# Patient Record
Sex: Female | Born: 1975 | Race: White | Hispanic: No | Marital: Married | Smoking: Never smoker
Health system: Southern US, Community
[De-identification: ages and names within clinical notes are randomized; demographics above are authoritative.]

## PROBLEM LIST (undated history)

## (undated) DIAGNOSIS — K219 Gastro-esophageal reflux disease without esophagitis: Secondary | ICD-10-CM

## (undated) DIAGNOSIS — N289 Disorder of kidney and ureter, unspecified: Secondary | ICD-10-CM

## (undated) DIAGNOSIS — F329 Major depressive disorder, single episode, unspecified: Secondary | ICD-10-CM

## (undated) DIAGNOSIS — F32A Depression, unspecified: Secondary | ICD-10-CM

## (undated) DIAGNOSIS — I10 Essential (primary) hypertension: Secondary | ICD-10-CM

---

## 1997-07-23 ENCOUNTER — Other Ambulatory Visit: Admission: RE | Admit: 1997-07-23 | Discharge: 1997-07-23 | Payer: Self-pay | Admitting: Obstetrics and Gynecology

## 1998-01-22 ENCOUNTER — Inpatient Hospital Stay (HOSPITAL_COMMUNITY): Admission: AD | Admit: 1998-01-22 | Discharge: 1998-01-25 | Payer: Self-pay | Admitting: Gynecology

## 1998-01-27 ENCOUNTER — Encounter (HOSPITAL_COMMUNITY): Admission: RE | Admit: 1998-01-27 | Discharge: 1998-03-20 | Payer: Self-pay | Admitting: Obstetrics and Gynecology

## 1998-03-09 ENCOUNTER — Other Ambulatory Visit: Admission: RE | Admit: 1998-03-09 | Discharge: 1998-03-09 | Payer: Self-pay | Admitting: Obstetrics and Gynecology

## 1999-03-16 ENCOUNTER — Other Ambulatory Visit: Admission: RE | Admit: 1999-03-16 | Discharge: 1999-03-16 | Payer: Self-pay | Admitting: Obstetrics and Gynecology

## 2000-04-17 ENCOUNTER — Other Ambulatory Visit: Admission: RE | Admit: 2000-04-17 | Discharge: 2000-04-17 | Payer: Self-pay | Admitting: Gynecology

## 2000-04-18 ENCOUNTER — Other Ambulatory Visit: Admission: RE | Admit: 2000-04-18 | Discharge: 2000-04-18 | Payer: Self-pay | Admitting: Gynecology

## 2000-11-30 ENCOUNTER — Other Ambulatory Visit: Admission: RE | Admit: 2000-11-30 | Discharge: 2000-11-30 | Payer: Self-pay | Admitting: Gynecology

## 2001-06-25 ENCOUNTER — Inpatient Hospital Stay (HOSPITAL_COMMUNITY): Admission: AD | Admit: 2001-06-25 | Discharge: 2001-06-27 | Payer: Self-pay | Admitting: *Deleted

## 2001-08-06 ENCOUNTER — Other Ambulatory Visit: Admission: RE | Admit: 2001-08-06 | Discharge: 2001-08-06 | Payer: Self-pay | Admitting: *Deleted

## 2002-09-06 ENCOUNTER — Other Ambulatory Visit: Admission: RE | Admit: 2002-09-06 | Discharge: 2002-09-06 | Payer: Self-pay | Admitting: Gynecology

## 2003-09-09 ENCOUNTER — Other Ambulatory Visit: Admission: RE | Admit: 2003-09-09 | Discharge: 2003-09-09 | Payer: Self-pay | Admitting: Gynecology

## 2004-09-15 ENCOUNTER — Other Ambulatory Visit: Admission: RE | Admit: 2004-09-15 | Discharge: 2004-09-15 | Payer: Self-pay | Admitting: Gynecology

## 2005-09-27 ENCOUNTER — Other Ambulatory Visit: Admission: RE | Admit: 2005-09-27 | Discharge: 2005-09-27 | Payer: Self-pay | Admitting: Gynecology

## 2011-08-21 ENCOUNTER — Emergency Department (INDEPENDENT_AMBULATORY_CARE_PROVIDER_SITE_OTHER): Payer: BC Managed Care – PPO

## 2011-08-21 ENCOUNTER — Encounter (HOSPITAL_BASED_OUTPATIENT_CLINIC_OR_DEPARTMENT_OTHER): Payer: Self-pay | Admitting: *Deleted

## 2011-08-21 ENCOUNTER — Emergency Department (HOSPITAL_BASED_OUTPATIENT_CLINIC_OR_DEPARTMENT_OTHER)
Admission: EM | Admit: 2011-08-21 | Discharge: 2011-08-21 | Disposition: A | Discharge status: Home / Self Care | Payer: BC Managed Care – PPO | Attending: Emergency Medicine | Admitting: Emergency Medicine

## 2011-08-21 DIAGNOSIS — M25529 Pain in unspecified elbow: Secondary | ICD-10-CM | POA: Insufficient documentation

## 2011-08-21 DIAGNOSIS — S53106A Unspecified dislocation of unspecified ulnohumeral joint, initial encounter: Secondary | ICD-10-CM | POA: Insufficient documentation

## 2011-08-21 DIAGNOSIS — W19XXXA Unspecified fall, initial encounter: Secondary | ICD-10-CM

## 2011-08-21 DIAGNOSIS — W1809XA Striking against other object with subsequent fall, initial encounter: Secondary | ICD-10-CM | POA: Insufficient documentation

## 2011-08-21 DIAGNOSIS — Z09 Encounter for follow-up examination after completed treatment for conditions other than malignant neoplasm: Secondary | ICD-10-CM

## 2011-08-21 MED ORDER — KETAMINE HCL 10 MG/ML IJ SOLN
INTRAMUSCULAR | Status: AC
  Administered 2011-08-21: 129 mg via INTRAVENOUS
  Filled 2011-08-21: qty 1

## 2011-08-21 MED ORDER — OXYCODONE-ACETAMINOPHEN 5-325 MG PO TABS: 1.0000 | ORAL_TABLET | ORAL | Status: AC | PRN

## 2011-08-21 MED ORDER — ONDANSETRON HCL 4 MG/2ML IJ SOLN
INTRAMUSCULAR | Status: AC
  Administered 2011-08-21: 4 mg via INTRAVENOUS
  Filled 2011-08-21: qty 2

## 2011-08-21 MED ORDER — KETAMINE HCL 50 MG/ML IJ SOLN: 1.5000 mg/kg | Freq: Once | INTRAMUSCULAR | Status: DC

## 2011-08-21 MED ORDER — ONDANSETRON 8 MG PO TBDP: 8.0000 mg | ORAL_TABLET | Freq: Three times a day (TID) | ORAL | Status: AC | PRN

## 2011-08-21 MED ORDER — ONDANSETRON 8 MG PO TBDP
ORAL_TABLET | ORAL | Status: AC
  Filled 2011-08-21: qty 1

## 2011-08-21 MED ORDER — HYDROMORPHONE HCL PF 1 MG/ML IJ SOLN
1.0000 mg | Freq: Once | INTRAMUSCULAR | Status: AC
  Administered 2011-08-21: 1 mg via INTRAVENOUS
  Filled 2011-08-21: qty 1

## 2011-08-21 MED ORDER — SODIUM CHLORIDE 0.9 % IV SOLN
Freq: Once | INTRAVENOUS | Status: AC
  Administered 2011-08-21: 20:00:00 via INTRAVENOUS

## 2011-08-21 MED ORDER — ONDANSETRON 8 MG PO TBDP
8.0000 mg | ORAL_TABLET | Freq: Once | ORAL | Status: AC
  Administered 2011-08-21: 8 mg via ORAL

## 2011-08-21 NOTE — ED Notes (Signed)
Place 3rd call to [facility] for hand surgery-MT

## 2011-08-21 NOTE — ED Notes (Signed)
Patient placed in sugartong splint with help from Onalee Hua, EMT. Sling reapplied and instructions given on care of splint.

## 2011-08-21 NOTE — ED Notes (Signed)
Patient states that she was walking down a [place] and fell. Her right arm is injured, clear deformity to elbow.

## 2011-08-21 NOTE — ED Provider Notes (Signed)
History   This chart was scribed for Hilario Quarry, MD by Charolett Bumpers . The patient was seen in room MH03/MH03.    CSN: [id number]  Arrival date & time 08/21/11  4098   First MD Initiated Contact with Patient 08/21/11 1901      Chief Complaint  Patient presents with  . Arm Injury    (Consider location/radiation/quality/duration/timing/severity/associated sxs/prior treatment) HPI Jenny Vasquez is a 36 y.o. female who presents to the Emergency Department complaining of constant, moderate right elbow pain that started PTA. Patient states she was walking when she she fell on her right arm. Patient states that she caught herself with her right forearm. Patient states that the pain is concentrated in her elbow. Patient denies any weakness, tingling or numbness in extremities. Patient denies taking anything for pain. Patient denies any other injuries or symptoms. Patient denies any pertinent medical or surgical hx.    History reviewed. No pertinent past medical history.  History reviewed. No pertinent past surgical history.  No family history on file.  History  Substance Use Topics  . Smoking status: Not on file  . Smokeless tobacco: Not on file  . Alcohol Use: Not on file    OB History    Grav Para Term Preterm Abortions TAB SAB Ect Mult Living                  Review of Systems  Musculoskeletal:       Right elbow pain.   All other systems reviewed and are negative.    Allergies  Penicillins and Diflucan  Home Medications   Current Outpatient Rx  Name Route Sig Dispense Refill  . BUPROPION HCL ER (XL) 150 MG PO TB24 Oral Take 150 mg by mouth daily.    . IBUPROFEN 200 MG PO TABS Oral Take 400 mg by mouth every 6 (six) hours as needed. Patient used this medication for headache.      BP 111/55  Pulse 62  Temp(Src) 97.6 F (36.4 C) (Oral)  Resp 20  SpO2 99%  LMP 08/11/2011  Physical Exam  Nursing note and vitals reviewed. Constitutional:  She is oriented to person, place, and time. She appears well-developed and well-nourished. No distress.  HENT:  Head: Normocephalic and atraumatic.  Right Ear: External ear normal.  Eyes: EOM are normal. Pupils are equal, round, and reactive to light.  Neck: Neck supple. No tracheal deviation present.  Cardiovascular: Normal rate.   Pulmonary/Chest: Effort normal. No respiratory distress.  Abdominal: Soft. She exhibits no distension.  Musculoskeletal: She exhibits no edema.       Right elbow: She exhibits decreased range of motion and deformity. tenderness found.       Right forearm sensation intact. Right radial pulse normal.   Neurological: She is alert and oriented to person, place, and time. No sensory deficit.  Skin: Skin is warm and dry.  Psychiatric: She has a normal mood and affect. Her behavior is normal.    ED Course  Reduction of fracture Date/Time: 08/21/2011 9:00 PM Performed by: Hilario Quarry Authorized by: Hilario Quarry Consent: Verbal consent obtained. Risks and benefits: risks, benefits and alternatives were discussed Consent given by: patient Patient understanding: patient states understanding of the procedure being performed Patient identity confirmed: verbally with patient and arm band Time out: Immediately prior to procedure a "time out" was called to verify the correct patient, procedure, equipment, support staff and site/side marked as required. Local anesthesia used: no Patient  sedated: yes Sedatives: ketamine Analgesia: hydromorphone Sedation start date/time: 08/21/2011 8:30 PM Sedation end date/time: 08/21/2011 9:04 PM Vitals: Vital signs were monitored during sedation. Patient tolerance: Patient tolerated the procedure well with no immediate complications.  Reduction of fracture Date/Time: 08/21/2011 9:05 PM Performed by: Hilario Quarry Authorized by: Hilario Quarry Consent: Verbal consent obtained. Written consent obtained. Risks and benefits:  risks, benefits and alternatives were discussed Consent given by: patient Patient understanding: patient states understanding of the procedure being performed Patient consent: the patient's understanding of the procedure matches consent given Time out: Immediately prior to procedure a "time out" was called to verify the correct patient, procedure, equipment, support staff and site/side marked as required. Local anesthesia used: no Patient sedated: yes Patient tolerance: Patient tolerated the procedure well with no immediate complications. Comments: Right elbow fracture dislocation reduced with manipulation and flexion/extension.  Palpable reduction of dislocation.  Pulsed remain in place. Splint placed.  Finger with good sensation pink with good cap refill.    (including critical care time)  DIAGNOSTIC STUDIES: Oxygen Saturation is 99% on room air, normal by my interpretation.    COORDINATION OF CARE:  1908: Discussed planned course of treatment with the patient who is agreeable at this time. Will order x-rays.  1930: Medication Orders: Hydromorphone (Dilaudid) injection 1 mg-once 1945: Medication Orders: 0.9% Sodium Chloride infusion.  2029: Preformed a reduction on the dislocated right elbow.    Labs Reviewed - No data to display Dg Elbow 2 Views Right  08/21/2011  *RADIOLOGY REPORT*  Clinical Data: Fall.  Elbow pain.  RIGHT ELBOW - 2 VIEW  Comparison: None.  Findings: The frontal projection is obliqued due to limitations in patient positioning.  Elbow dislocation noted with radius and ulna displaced to the side of the distal humerus, probably laterally although correlation with physical exam is suggested as I am uncertain of the degree of internal or external rotation of the elbow on the oblique projection.  There does appear to be an abnormal bony fragment projecting over the proximal ulna on the lateral projection and projecting between the radius and humerus on the oblique projection,  possibly representing a fragment from the distal humerus or coronoid process region.  IMPRESSION:  1.  Fracture dislocation of the elbow.  Original Report Authenticated By: Dellia Cloud, M.D.     No diagnosis found.  Patient's care discussed with Dr. Mina Marble called. She is to call the office tomorrow to be seen on Tuesday for followup.  MDM  Patient with sugar tong applied with fingers remaining pink with good cap refill and pulses intact.      Hilario Quarry, MD 08/21/11 2152

## 2011-08-21 NOTE — Discharge Instructions (Signed)
Elbow Dislocation  Elbow dislocation is the displacement of the bones that form the elbow joint. Three bones come together to form the elbow. The humerus is the bone in the upper arm. The radius and ulna are the 2 bones in the forearm that form the lower part of the elbow. The elbow is held in place by very strong, fibrous tissues (ligaments) that connect the bones to each other.  CAUSES  Elbow dislocations are not common. Typically, they occur when a person falls forward with hands and elbows outstretched. The force of the impact is sent to the elbow. Usually, there is a twisting motion in this force. Elbow dislocations also happen during car crashes when passengers reach out to brace themselves during the impact.  RISK FACTORS  Although dislocation of the elbow can happen to anyone, some people are at greater risk than others. People at increased risk of elbow dislocation include:   People born with greater looseness in their ligaments.   People born with an ulna bone that has a shallow groove for the elbow hinge joint.  SYMPTOMS  Symptoms of a complete elbow dislocation usually are obvious. They include extreme pain and the appearance of a deformed arm.   Symptoms of a partial dislocation may not be obvious. Your elbow may move somewhat, but you may have pain and swelling. Also, there will likely be bruising on the inside and outside of your elbow where ligaments have been stretched or torn.   DIAGNOSIS   To diagnose elbow dislocation, your caregiver will perform a physical exam. During this exam, your caregiver will check your arm for tenderness, swelling, and deformity. The skin around your arm and the circulation in your arm also will be checked. Your pulse will be checked at your wrist. If your artery is injured during dislocation, your hand will be cool to the touch and may be white or purple in color. Your caregiver also may check your arm and your ability to move your wrist and fingers to see if you had  any damage to your nerves during dislocation.  An X-Basem Yannuzzi exam also may be done to determine if there is bone injury. Results of an X-Rakeya Glab exam can help show the direction of the dislocation.  If you have a simple dislocation, there is no major bone injury. If you have a complex dislocation, you may have broken bones (fractures) associated with the ligament injuries.  TREATMENT  For a simple elbow dislocation, your bones can usually be realigned in a procedure called a reduction. This is a treatment in which your bones are manually moved back into place either with the use of numbing medicine (regional anesthetic) around your elbow or medicine to make you sleep (general anesthetic). Then your elbow is kept immobile with a sling or a splint for 2 to 3 weeks. This is followed with physical therapy to help your joint move again.  Complex elbow dislocation may require surgery to restore joint alignment and repair ligaments. After surgery, your elbow may be protected with an external hinge. This device keeps your elbow from dislocating again while motion exercises are done. Additional surgery may be needed to repair any injuries to blood vessels and nerves or bones and ligaments or to relieve pressure from excessive swelling around the muscles.  HOME CARE INSTRUCTIONS  The following measures can help to reduce pain and hasten the healing process:   Rest your injured joint. Do not move it. Avoid activities similar to the one that caused   to your injured joint for 1 to 2 days after your reduction or as directed by your caregiver. Applying ice helps to reduce inflammation and pain.   Put ice in a plastic bag.   Place a towel between your skin and the bag.   Leave the ice on for 15 to 20 minutes at a time, every couple of hours while you are awake.   Elevate your arm above your heart and move your wrist and fingers as  instructed by your caregiver to help limit swelling.   Take over-the-counter or prescription medicines for pain as directed by your caregiver.  SEEK IMMEDIATE MEDICAL CARE IF:  Your splint becomes damaged.   You have an external hinge and it becomes loose or will not move.   You have an external hinge and you develop drainage around the pins.   Your pain becomes worse rather than better.   You lose feeling in your hand or fingers.  MAKE SURE YOU:  Understand these instructions.   Will watch your condition.   Will get help right away if you are not doing well or get worse.  Document Released: 03/22/2001 Document Revised: 03/17/2011 Document Reviewed: 08/26/2010 [facility] Patient Information 2012 [place], [place].  Please call Dr. Mina Marble office first thing tomorrow for followup appointment on Tuesday. Please keep arm elevated with ice on elbow. Use pain medicine as needed.

## 2011-08-23 ENCOUNTER — Ambulatory Visit
Admission: RE | Admit: 2011-08-23 | Discharge: 2011-08-23 | Disposition: A | Discharge status: Home / Self Care | Payer: BC Managed Care – PPO | Source: Ambulatory Visit | Attending: Orthopedic Surgery | Admitting: Orthopedic Surgery

## 2011-08-23 ENCOUNTER — Other Ambulatory Visit: Payer: Self-pay | Admitting: Orthopedic Surgery

## 2011-08-23 DIAGNOSIS — M25521 Pain in right elbow: Secondary | ICD-10-CM

## 2016-07-14 ENCOUNTER — Emergency Department (HOSPITAL_BASED_OUTPATIENT_CLINIC_OR_DEPARTMENT_OTHER)
Admission: EM | Admit: 2016-07-14 | Discharge: 2016-07-14 | Disposition: A | Discharge status: Home / Self Care | Payer: 59 | Attending: Emergency Medicine | Admitting: Emergency Medicine

## 2016-07-14 ENCOUNTER — Encounter (HOSPITAL_BASED_OUTPATIENT_CLINIC_OR_DEPARTMENT_OTHER): Payer: Self-pay | Admitting: Emergency Medicine

## 2016-07-14 ENCOUNTER — Emergency Department (HOSPITAL_BASED_OUTPATIENT_CLINIC_OR_DEPARTMENT_OTHER): Payer: 59

## 2016-07-14 DIAGNOSIS — Y929 Unspecified place or not applicable: Secondary | ICD-10-CM | POA: Diagnosis not present

## 2016-07-14 DIAGNOSIS — R55 Syncope and collapse: Secondary | ICD-10-CM | POA: Diagnosis not present

## 2016-07-14 DIAGNOSIS — Y939 Activity, unspecified: Secondary | ICD-10-CM | POA: Insufficient documentation

## 2016-07-14 DIAGNOSIS — Y999 Unspecified external cause status: Secondary | ICD-10-CM | POA: Diagnosis not present

## 2016-07-14 DIAGNOSIS — X58XXXA Exposure to other specified factors, initial encounter: Secondary | ICD-10-CM | POA: Insufficient documentation

## 2016-07-14 DIAGNOSIS — S0993XA Unspecified injury of face, initial encounter: Secondary | ICD-10-CM | POA: Diagnosis present

## 2016-07-14 DIAGNOSIS — S00512A Abrasion of oral cavity, initial encounter: Secondary | ICD-10-CM | POA: Diagnosis not present

## 2016-07-14 DIAGNOSIS — Z79899 Other long term (current) drug therapy: Secondary | ICD-10-CM | POA: Diagnosis not present

## 2016-07-14 DIAGNOSIS — I10 Essential (primary) hypertension: Secondary | ICD-10-CM | POA: Diagnosis not present

## 2016-07-14 HISTORY — DX: Disorder of kidney and ureter, unspecified: N28.9

## 2016-07-14 HISTORY — DX: Major depressive disorder, single episode, unspecified: F32.9

## 2016-07-14 HISTORY — DX: Depression, unspecified: F32.A

## 2016-07-14 HISTORY — DX: Essential (primary) hypertension: I10

## 2016-07-14 HISTORY — DX: Gastro-esophageal reflux disease without esophagitis: K21.9

## 2016-07-14 LAB — COMPREHENSIVE METABOLIC PANEL
ALBUMIN: 4.2 g/dL (ref 3.5–5.0)
ALK PHOS: 113 U/L (ref 38–126)
ALT: 37 U/L (ref 14–54)
ANION GAP: 7 (ref 5–15)
AST: 33 U/L (ref 15–41)
BILIRUBIN TOTAL: 0.6 mg/dL (ref 0.3–1.2)
BUN: 17 mg/dL (ref 6–20)
CALCIUM: 9.4 mg/dL (ref 8.9–10.3)
CO2: 25 mmol/L (ref 22–32)
CREATININE: 1.1 mg/dL — AB (ref 0.44–1.00)
Chloride: 102 mmol/L (ref 101–111)
GFR calc Af Amer: >60 mL/min (ref >60)
GFR calc non Af Amer: >60 mL/min (ref >60)
GLUCOSE: 110 mg/dL — AB (ref 65–99)
Potassium: 3.6 mmol/L (ref 3.5–5.1)
Sodium: 134 mmol/L — ABNORMAL LOW (ref 135–145)
TOTAL PROTEIN: 7.4 g/dL (ref 6.5–8.1)

## 2016-07-14 LAB — URINALYSIS, ROUTINE W REFLEX MICROSCOPIC
Glucose, UA: NEGATIVE mg/dL
HGB URINE DIPSTICK: NEGATIVE
Ketones, ur: 15 mg/dL — AB
Leukocytes, UA: NEGATIVE
Nitrite: NEGATIVE
PROTEIN: 30 mg/dL — AB
Specific Gravity, Urine: 1.03 (ref 1.005–1.030)
pH: 5.5 (ref 5.0–8.0)

## 2016-07-14 LAB — URINALYSIS, MICROSCOPIC (REFLEX): WBC, UA: NONE SEEN WBC/hpf (ref 0–5)

## 2016-07-14 LAB — CBC WITH DIFFERENTIAL/PLATELET
BASOS PCT: 0 %
Basophils Absolute: 0 10*3/uL (ref 0.0–0.1)
Eosinophils Absolute: 0.1 10*3/uL (ref 0.0–0.7)
Eosinophils Relative: 1 %
HEMATOCRIT: 40 % (ref 36.0–46.0)
HEMOGLOBIN: 13.1 g/dL (ref 12.0–15.0)
Lymphocytes Relative: 16 %
Lymphs Abs: 1.4 10*3/uL (ref 0.7–4.0)
MCH: 25.9 pg — ABNORMAL LOW (ref 26.0–34.0)
MCHC: 32.8 g/dL (ref 30.0–36.0)
MCV: 79.2 fL (ref 78.0–100.0)
MONOS PCT: 8 %
Monocytes Absolute: 0.7 10*3/uL (ref 0.1–1.0)
NEUTROS ABS: 6.1 10*3/uL (ref 1.7–7.7)
NEUTROS PCT: 75 %
Platelets: 312 10*3/uL (ref 150–400)
RBC: 5.05 MIL/uL (ref 3.87–5.11)
RDW: 14.6 % (ref 11.5–15.5)
WBC: 8.3 10*3/uL (ref 4.0–10.5)

## 2016-07-14 LAB — TROPONIN I: Troponin I: <0.03 ng/mL (ref <0.03)

## 2016-07-14 LAB — RAPID URINE DRUG SCREEN, HOSP PERFORMED
Amphetamines: NOT DETECTED
BARBITURATES: NOT DETECTED
Benzodiazepines: NOT DETECTED
COCAINE: NOT DETECTED
Opiates: NOT DETECTED
Tetrahydrocannabinol: NOT DETECTED

## 2016-07-14 LAB — ETHANOL: Alcohol, Ethyl (B): <5 mg/dL (ref <5)

## 2016-07-14 LAB — PREGNANCY, URINE: PREG TEST UR: NEGATIVE

## 2016-07-14 NOTE — ED Notes (Signed)
Pt returned from CT °

## 2016-07-14 NOTE — ED Triage Notes (Addendum)
Pt reports syncopal episode while at [facility] 30 min ago. Pt states she felt hot prior to this episode. Pt states she feels nauseated now. Pt reports drinking one glass of wine, states she is normally not a big alcohol drinker. Pt endorses nausea. EMS was called, blood sugar was 110.

## 2016-07-14 NOTE — ED Notes (Signed)
ED Provider at bedside. 

## 2016-07-14 NOTE — ED Notes (Signed)
Pt had a syncopal episode this evening that lasted about 30 seconds per husband.  Pt denies chest pain, denies dizziness, denies SOB, denies headache.  She states she ate normally today, and denies any symptoms when she had the syncopal episode.

## 2016-07-14 NOTE — ED Provider Notes (Signed)
[facility] Provider Note   CSN: [id number] Arrival date & time: 07/14/16  2017  By signing my name below, I, Sonum Patel, attest that this documentation has been prepared under the direction and in the presence of Loren Racer, MD. Electronically Signed: Sonum Patel, Neurosurgeon. 07/14/16. 10:03 PM.  History   Chief Complaint Chief Complaint  Patient presents with  . Loss of Consciousness    The history is provided by the patient. No language interpreter was used.     HPI Comments: Jenny Vasquez is a 41 y.o. female who presents to the Emergency Department complaining of an episode of LOC that occurred about 1.5 hours ago today. Patient was drinking a glass of "chocolate" wine in a restaurant when she began to feel hot and flushed. It was noted that patient began to nod her head and make "slurping noises" prior to losing consciousness. She was caught by a friend so she did not have a head injury. The episode lasted about 30-40 seconds and she remembers waking up lying on the ground surrounded by her friends prior to EMS arrival. She reports biting her tongue She reports eating and drinking well earlier today. She currently feels normal aside from feeling chest discomfort which she attributes to acid reflux. She denies recent changes in medications. 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 She denies associated HA, neck pain, nausea, leg swelling, leg pain.    Past Medical History:  Diagnosis Date  . Acid reflux   . Depression   . Hypertension   . Renal disorder     There are no active problems to display for this patient.   History reviewed. No pertinent surgical history.  OB History    No data available       Home Medications    Prior to Admission medications   Medication Sig Start Date End Date Taking? Authorizing Provider  amlodipine-atorvastatin (CADUET) 2.5-10 MG tablet Take 1 tablet by mouth daily.   Yes Historical Provider, MD    Cholecalciferol (VITAMIN D) 2000 units CAPS Take by mouth every other day.   Yes Historical Provider, MD  escitalopram (LEXAPRO) 5 MG tablet Take 5 mg by mouth daily.   Yes Historical Provider, MD  metoCLOPramide (REGLAN) 10 MG tablet Take 10 mg by mouth 2 (two) times daily.   Yes Historical Provider, MD  pantoprazole (PROTONIX) 20 MG tablet Take 20 mg by mouth daily.   Yes Historical Provider, MD  buPROPion (WELLBUTRIN XL) 150 MG 24 hr tablet Take 150 mg by mouth daily.    Historical Provider, MD  ibuprofen (ADVIL,MOTRIN) 200 MG tablet Take 400 mg by mouth every 6 (six) hours as needed. Patient used this medication for headache.    Historical Provider, MD    Family History No family history on file.  Social History Social History  Substance Use Topics  . Smoking status: Never Smoker  . Smokeless tobacco: Never Used  . Alcohol use Yes     Allergies   Penicillins and Diflucan [fluconazole]   Review of Systems Review of Systems  Constitutional: Negative for chills and fever.  Respiratory: Negative for shortness of breath.   Cardiovascular: Negative for chest pain and leg swelling.  Gastrointestinal: Negative for abdominal pain, diarrhea, nausea and vomiting.  Genitourinary: Negative for difficulty urinating, dysuria, flank pain and frequency.  Musculoskeletal: Negative for back pain, myalgias and neck pain.  Skin: Negative for rash and wound.  Neurological: Positive for dizziness, syncope and  light-headedness. Negative for weakness, numbness and headaches.  All other systems reviewed and are negative.    Physical Exam Updated Vital Signs BP 113/78   Pulse 81   Temp 98.2 F (36.8 C) (Oral)   Resp 16   Ht  (1.651 m)   Wt 200 lb (90.7 kg)   LMP 07/03/2016   SpO2 98%   BMI 33.28 kg/m   Physical Exam  Constitutional: She is oriented to person, place, and time. She appears well-developed and well-nourished.  HENT:  Head: Normocephalic and atraumatic.   Mouth/Throat: Oropharynx is clear and moist.  Small abrasion to the tip of the tongue on the right  Eyes: EOM are normal. Pupils are equal, round, and reactive to light.  Neck: Normal range of motion. Neck supple.  No posterior midline cervical tenderness to palpation.  Cardiovascular: Normal rate and regular rhythm.  Exam reveals no gallop and no friction rub.   No murmur heard. Pulmonary/Chest: Effort normal and breath sounds normal. No respiratory distress. She has no wheezes. She has no rales. She exhibits no tenderness.  Abdominal: Soft. Bowel sounds are normal. There is no tenderness. There is no rebound and no guarding.  Musculoskeletal: Normal range of motion. She exhibits no edema or tenderness.  No lower extremity swelling, asymmetry or tenderness. Distal pulses are 2+.  Neurological: She is alert and oriented to person, place, and time.  Patient is alert and oriented x3 with clear, goal oriented speech. Patient has 5/5 motor in all extremities. Sensation is intact to light touch. Bilateral finger-to-nose is normal with no signs of dysmetria. Patient has a normal gait and walks without assistance.  Skin: Skin is warm and dry. Capillary refill takes less than 2 seconds. No rash noted. No erythema.  Psychiatric: She has a normal mood and affect. Her behavior is normal.  Nursing note and vitals reviewed.    ED Treatments / Results  DIAGNOSTIC STUDIES: Oxygen Saturation is 98% on RA, normal by my interpretation.    COORDINATION OF CARE: 9:50 PM Discussed treatment plan with pt at bedside and pt agreed to plan.   Labs (all labs ordered are listed, but only abnormal results are displayed) Labs Reviewed  URINALYSIS, ROUTINE W REFLEX MICROSCOPIC - Abnormal; Notable for the following:       Result Value   APPearance CLOUDY (*)    Bilirubin Urine SMALL (*)    Ketones, ur 15 (*)    Protein, ur 30 (*)    All other components within normal limits  URINALYSIS, MICROSCOPIC (REFLEX) -  Abnormal; Notable for the following:    Bacteria, UA FEW (*)    Squamous Epithelial / LPF 0-5 (*)    All other components within normal limits  CBC WITH DIFFERENTIAL/PLATELET - Abnormal; Notable for the following:    MCH 25.9 (*)    All other components within normal limits  COMPREHENSIVE METABOLIC PANEL - Abnormal; Notable for the following:    Sodium 134 (*)    Glucose, Bld 110 (*)    Creatinine, Ser 1.10 (*)    All other components within normal limits  PREGNANCY, URINE  ETHANOL  RAPID URINE DRUG SCREEN, HOSP PERFORMED  TROPONIN I    EKG  EKG Interpretation  Date/Time:  Thursday July 14 2016 20:24:17 EDT Ventricular Rate:  80 PR Interval:  148 QRS Duration: 80 QT Interval:  368 QTC Calculation: 424 R Axis:   82 Text Interpretation:  Normal sinus rhythm Normal ECG Confirmed by Ranae Palms  MD, Miguel Christiana (  [id number]) on 07/14/2016 9:34:47 PM       Radiology Ct Head Wo Contrast  Result Date: 07/14/2016 CLINICAL DATA:  Syncopal episode EXAM: CT HEAD WITHOUT CONTRAST TECHNIQUE: Contiguous axial images were obtained from the base of the skull through the vertex without intravenous contrast. COMPARISON:  05/25/2011 FINDINGS: Brain: No acute territorial infarction, haemorrhage or suspicious mass is visualized. Small corpus callosum lipoma unchanged. Ventricles are nonenlarged. Vascular: No hyperdense vessels. Scattered calcifications at the carotid siphons. Skull: No fracture.  No suspicious bone lesion. Sinuses/Orbits: No acute finding. Other: None IMPRESSION: No definite CT evidence for acute intracranial abnormality. Small corpus callosum lipoma. Electronically Signed   By: Jasmine Pang M.D.   On: 07/14/2016 22:56    Procedures Procedures (including critical care time)  Medications Ordered in ED Medications - No data to display   Initial Impression / Assessment and Plan / ED Course  I have reviewed the triage vital signs and the nursing notes.  Pertinent labs & imaging results that  were available during my care of the patient were reviewed by me and considered in my medical decision making (see chart for details).     Patient remains neurologically intact. Workup is nonfocal. Syncope versus possible seizure like activity. Discussed with both patient and patient's husband the need to follow-up closely with neurology. Return precautions been given.  Final Clinical Impressions(s) / ED Diagnoses   Final diagnoses:  Syncope and collapse    New Prescriptions New Prescriptions   No medications on file   I personally performed the services described in this documentation, which was scribed in my presence. The recorded information has been reviewed and is accurate.      Loren Racer, MD 07/14/16 [id number]

## 2016-07-14 NOTE — ED Notes (Signed)
Patient transported to CT 

## 2018-10-02 IMAGING — CT CT HEAD W/O CM
3 series · 15 of 47 positions shown, 18 images · non-contrast
Comparison: 05/25/2011

CLINICAL DATA: Syncopal episode

EXAM:
CT HEAD WITHOUT CONTRAST
TECHNIQUE: Contiguous axial images were obtained from the base of the skull
through the vertex without intravenous contrast.

[Series 2: head wo · axial · 0.47mm/px · z∈[-179,-54]mm · 9 of 30 slices shown, 12 images]
[im 3/30  brain]
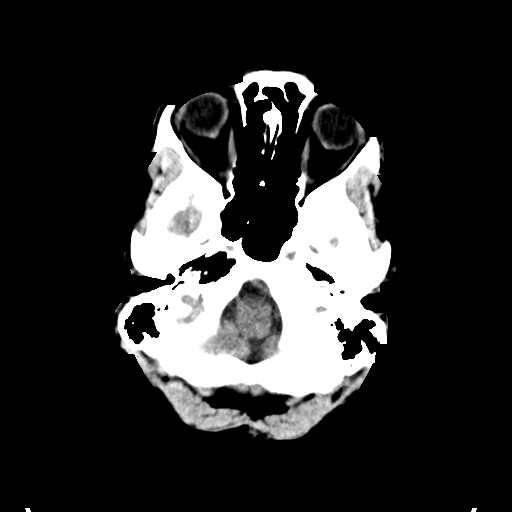
[im 3/30  bone]
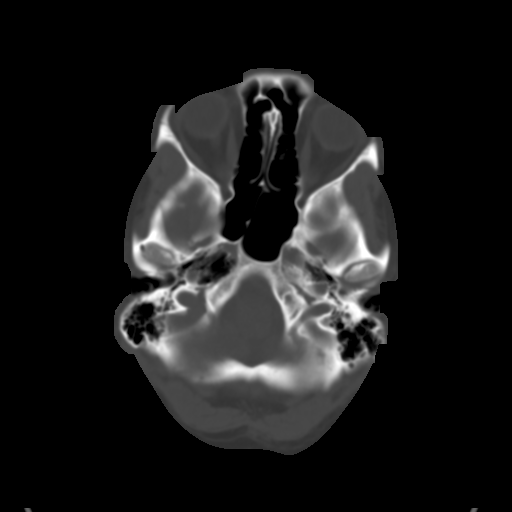
[im 6/30  brain]
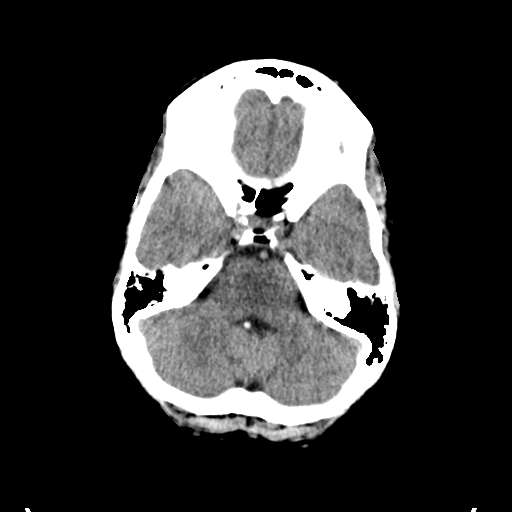
[im 9/30  brain]
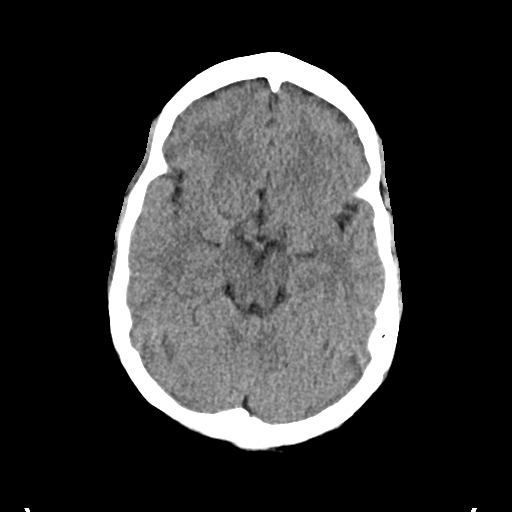
[im 12/30  brain]
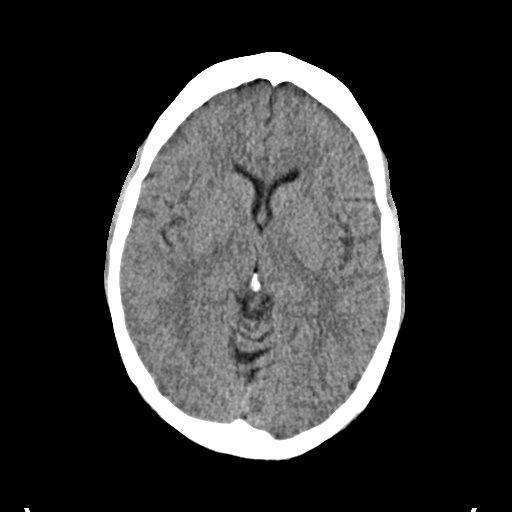
[im 16/30  brain]
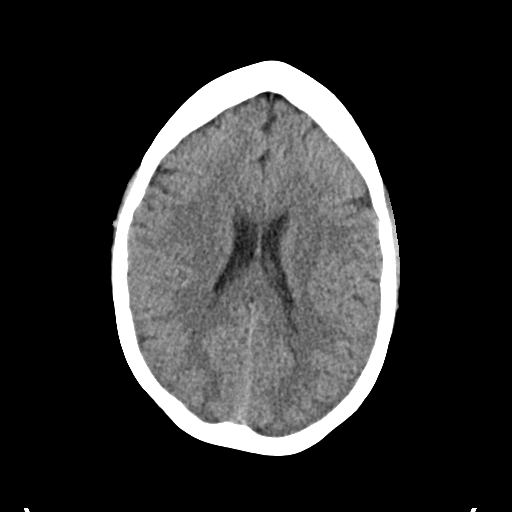
[im 16/30  bone]
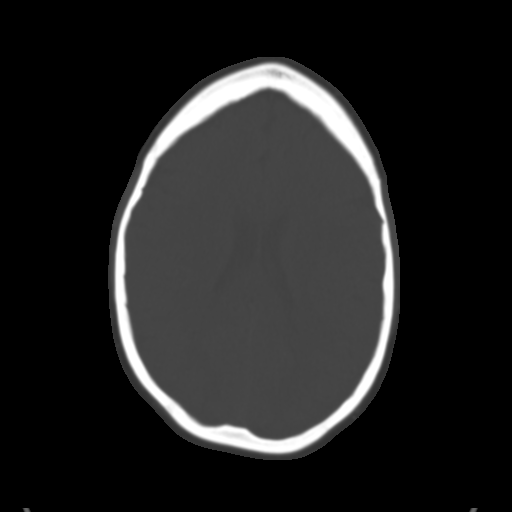
[im 19/30  brain]
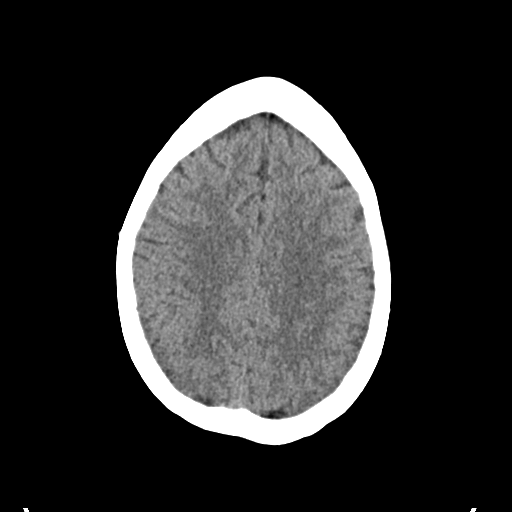
[im 22/30  brain]
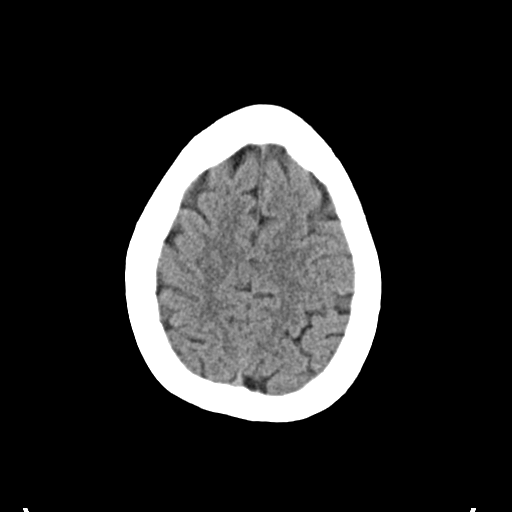
[im 25/30  brain]
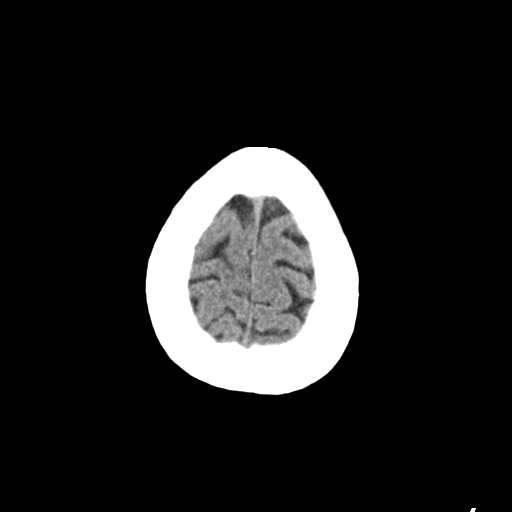
[im 28/30  brain]
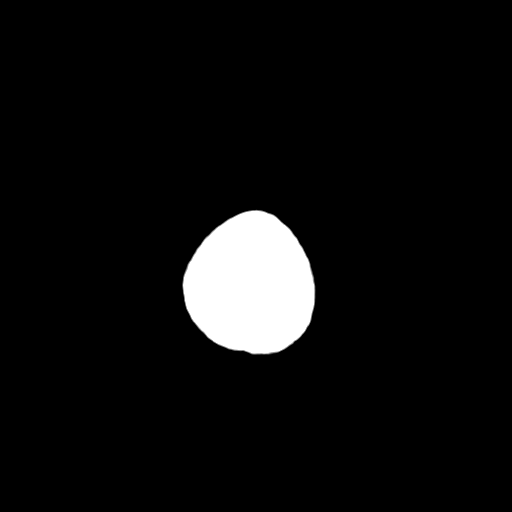
[im 28/30  bone]
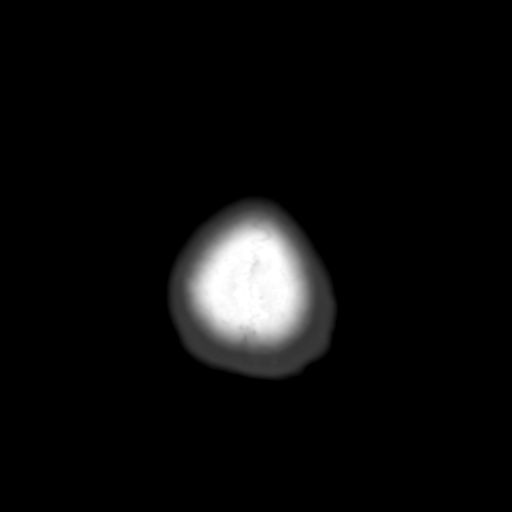

[Series 4: coronal soft · coronal · 0.30mm/px · 3 of 64 slices shown]
[im 22/64  brain]
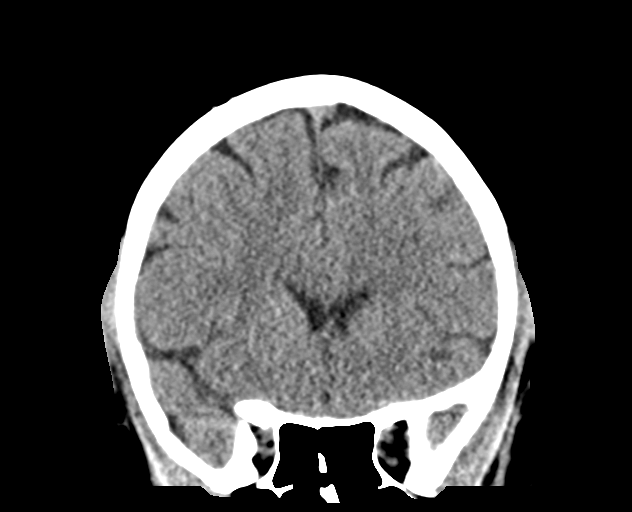
[im 29/64  brain]
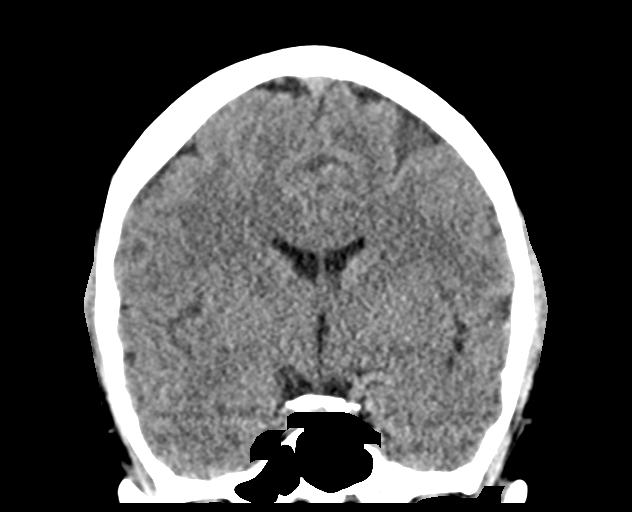
[im 36/64  brain]
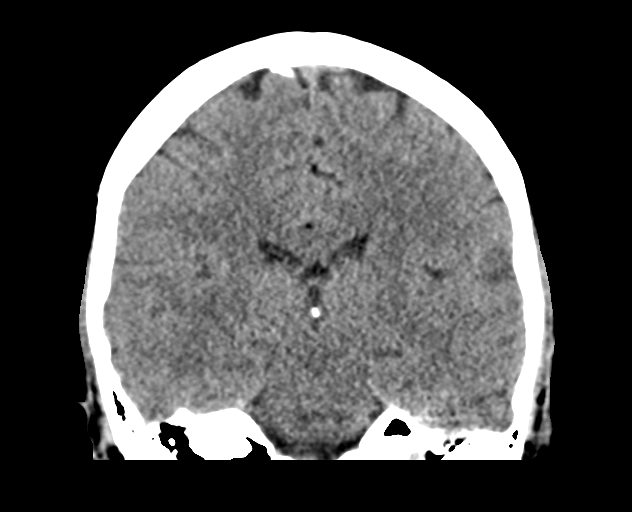

[Series 5: sag soft · sagittal · 0.29mm/px · 3 of 49 slices shown]
[im 17/49  brain]
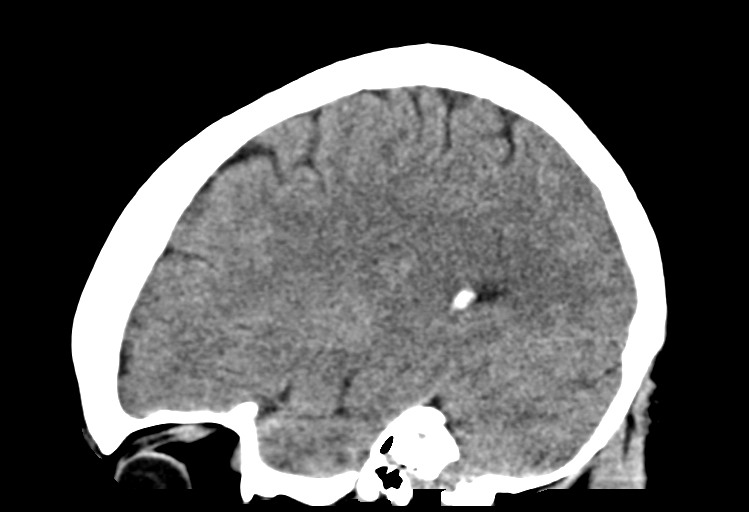
[im 25/49  brain]
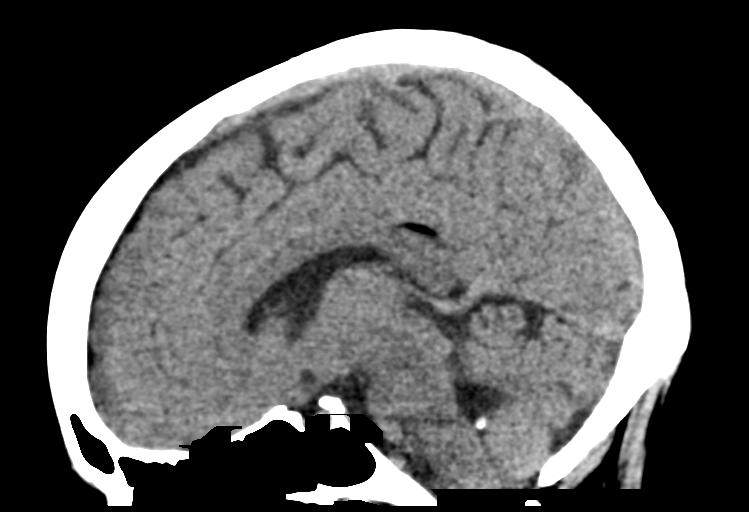
[im 33/49  brain]
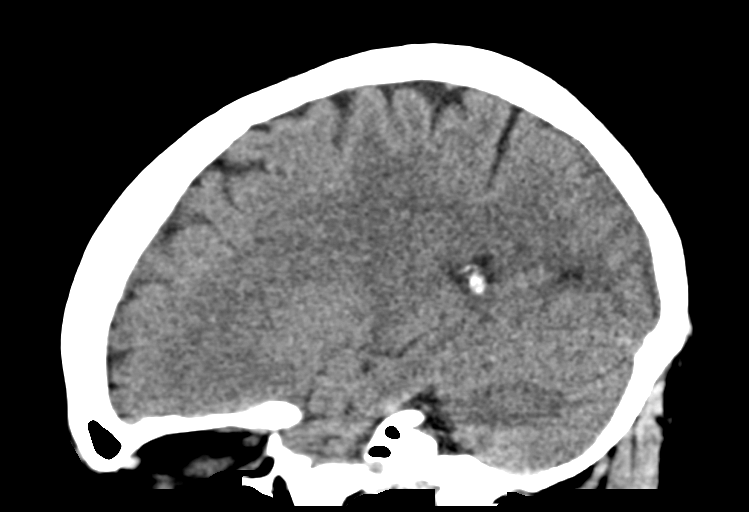

[15 of 47 positions shown; findings below may reference images not displayed]

FINDINGS: Brain: No acute territorial infarction, haemorrhage or suspicious
mass is visualized. Small corpus callosum lipoma unchanged.
Ventricles are nonenlarged.

Vascular: No hyperdense vessels. Scattered calcifications at the
carotid siphons.

Skull: No fracture.  No suspicious bone lesion.

Sinuses/Orbits: No acute finding.

Other: None
IMPRESSION: No definite CT evidence for acute intracranial abnormality. Small
corpus callosum lipoma.

## 2023-11-20 ENCOUNTER — Ambulatory Visit: Admitting: Urology

## 2023-11-28 ENCOUNTER — Telehealth: Payer: Self-pay | Admitting: Urology

## 2023-11-28 NOTE — Telephone Encounter (Signed)
 Called pt to offer her a earlier apt. Pt requested if we had any cancellations I have some available 11/29/23.

## 2024-01-15 ENCOUNTER — Encounter: Payer: Self-pay | Admitting: Urology

## 2024-01-15 ENCOUNTER — Ambulatory Visit: Admitting: Urology

## 2024-01-15 VITALS — BP 124/68 | HR 90 | Ht 64.0 in | Wt 245.0 lb

## 2024-01-15 DIAGNOSIS — N135 Crossing vessel and stricture of ureter without hydronephrosis: Secondary | ICD-10-CM | POA: Diagnosis not present

## 2024-01-15 DIAGNOSIS — R319 Hematuria, unspecified: Secondary | ICD-10-CM | POA: Diagnosis not present

## 2024-01-15 DIAGNOSIS — R3129 Other microscopic hematuria: Secondary | ICD-10-CM | POA: Insufficient documentation

## 2024-01-15 LAB — URINALYSIS, ROUTINE W REFLEX MICROSCOPIC
Glucose, UA: NEGATIVE
Leukocytes,UA: NEGATIVE
Nitrite, UA: NEGATIVE
Specific Gravity, UA: 1.025 (ref 1.005–1.030)
Urobilinogen, Ur: 0.2 mg/dL (ref 0.2–1.0)
pH, UA: 5.5 (ref 5.0–7.5)

## 2024-01-15 LAB — MICROSCOPIC EXAMINATION: Epithelial Cells (non renal): >10 /HPF — AB (ref 0–10)

## 2024-01-15 NOTE — Progress Notes (Signed)
 Assessment: 1. Hematuria, dipstick   2. Ureteral stricture, left     Plan: I personally reviewed the patient's chart including provider notes, lab and imaging results. I personally reviewed records from [facility]. Today I had a discussion with the patient regarding the findings of dipstick hematuria including the implications and differential diagnoses associated with it.  I also discussed recommendations for further evaluation including the rationale for upper tract imaging and cystoscopy.  I discussed the nature of these procedures including potential risk and complications.  The patient expressed an understanding of these issues. I discussed that recommendations for hematuria evaluation is based on the microscopic findings rather than the dipstick results.  She does not have evidence of microscopic hematuria on her urinalysis today. Recommend a repeat urinalysis in 1 month.   Chief Complaint:  Chief Complaint  Patient presents with   Hematuria    History of Present Illness:  Jenny Vasquez is a 48 y.o. female who is seen in consultation from Claudene Round, MD for evaluation of microscopic hematuria. Dipstick UA from 07/08/2023 showed trace blood. Dipstick UA from 08/09/2023 was positive for blood. Dipstick U/A from 10/19/23 showed small blood.  CT urogram from 07/24/2023 showed no renal masses, stones, or evidence of obstruction. No gross hematuria.  No history of UTIs.  She does have occasional urinary incontinence.  She wears a pad daily.  She was previously seen in [facility] at [facility] in November 2019 for a left ureteral stricture. She was found to have a slight elevation in her creatinine to 1.2 during routine laboratory testing on 04/03/15. Her prior creatinine was 1.1 in May 2016. Repeat creatinine on 04/20/15 increased slightly to 1.3. A renal ultrasound was obtained on 04/27/15 which showed mild right pelvocaliectasis, left hydronephrosis, and an  unremarkable bladder. She underwent cystoscopy with bilateral retrograde pyelograms, left ureteroscopy, balloon dilation of left ureteral stricture and insertion of left ureteral stent on 05/14/15. She was found to have narrow stricture in the left distal ureter with significant proximal dilation.  Lasix renogram from 06/10/15 showed split function of 85% on the right and 15% on the left, delayed perfusion and uptake by the left kidney with a T1/2 of 7 minutes on the left and 8.5 minutes on the right. She did well following stent removal.  IVP from 5/17 showed bilateral nephrograms at 5 minutes, delayed excretion from left with chronic dilation. Contrast was seen into distal ureter on left at 45 minutes.  She underwent evaluation with cystoscopy and retrograde pyelograms in August 2017. Narrowing of the left distal ureter was appreciated. Lasix renogram performed in August 2017 showed delayed uptake on the left with normal excretion following Lasix administration. Split function was 22% on the left and 78% on the right. T1/2 was 6 minutes on the left and 12 minutes on the right. Creatinine from 8/17 was 0.93. Creatinine from 11/17 was 1.2. Lasix renogram from 2/18 showed split function of 24% on left and 76% on right with T1/2 of 4.8 min on left and 5.1 min on right.  Lasix renogram from 12/18 showed split function of 23.8% on the left and 76.2% on the right with T1 a half of 7 minutes on the left and 4 minutes on the right.  Lasix renogram from 12/19 showed split function of 74% on the right and 26% on the left with T1/2 6.5 on the right and 6.5 minutes on the left.  Past Medical History:  Past Medical History:  Diagnosis  Date   Acid reflux    Depression    Hypertension    Renal disorder     Past Surgical History:  No past surgical history on file.  Allergies:  Allergies  Allergen Reactions   Penicillins Other (See Comments)    Childhood Reaction   Diflucan [Fluconazole] Rash    Family  History:  No family history on file.  Social History:  Social History   Tobacco Use   Smoking status: Never   Smokeless tobacco: Never  Substance Use Topics   Alcohol use: Yes   Drug use: No    Review of symptoms:  Constitutional:  Negative for unexplained weight loss, night sweats, fever, chills ENT:  Negative for nose bleeds, sinus pain, painful swallowing CV:  Negative for chest pain, shortness of breath, exercise intolerance, palpitations, loss of consciousness Resp:  Negative for cough, wheezing, shortness of breath GI:  Negative for nausea, vomiting, diarrhea, bloody stools GU:  Positives noted in HPI; otherwise negative for gross hematuria, dysuria Neuro:  Negative for seizures, poor balance, limb weakness, slurred speech Psych:  Negative for lack of energy, depression, anxiety Endocrine:  Negative for polydipsia, polyuria, symptoms of hypoglycemia (dizziness, hunger, sweating) Hematologic:  Negative for anemia, purpura, petechia, prolonged or excessive bleeding, use of anticoagulants  Allergic:  Negative for difficulty breathing or choking as a result of exposure to anything; no shellfish allergy; no allergic response (rash/itch) to materials, foods  Physical exam: BP 124/68   Pulse 90   Ht 5' 4 (1.626 m)   Wt 245 lb (111.1 kg)   BMI 42.05 kg/m  GENERAL APPEARANCE:  Well appearing, well developed, well nourished, NAD HEENT: Atraumatic, Normocephalic, oropharynx clear. NECK: Supple without lymphadenopathy or thyromegaly. LUNGS: Clear to auscultation bilaterally. HEART: Regular Rate and Rhythm without murmurs, gallops, or rubs. ABDOMEN: Soft, non-tender, No Masses. EXTREMITIES: Moves all extremities well.  Without clubbing, cyanosis, or edema. NEUROLOGIC:  Alert and oriented x 3, normal gait, CN II-XII grossly intact.  MENTAL STATUS:  Appropriate. BACK:  Non-tender to palpation.  No CVAT SKIN:  Warm, dry and intact.    Results: U/A: 0-5 WBCs, 0-2 RBCs, >10  epithelial cells

## 2024-02-12 ENCOUNTER — Other Ambulatory Visit: Payer: Self-pay

## 2024-02-12 DIAGNOSIS — R319 Hematuria, unspecified: Secondary | ICD-10-CM

## 2024-02-15 ENCOUNTER — Other Ambulatory Visit

## 2024-02-15 ENCOUNTER — Other Ambulatory Visit: Payer: Self-pay

## 2024-02-15 ENCOUNTER — Telehealth: Payer: Self-pay

## 2024-02-15 DIAGNOSIS — R319 Hematuria, unspecified: Secondary | ICD-10-CM

## 2024-02-15 LAB — URINALYSIS, ROUTINE W REFLEX MICROSCOPIC
Glucose, UA: NEGATIVE
Leukocytes,UA: NEGATIVE
Nitrite, UA: NEGATIVE
Specific Gravity, UA: >=1.03 — AB (ref 1.005–1.030)
Urobilinogen, Ur: 0.2 mg/dL (ref 0.2–1.0)
pH, UA: 5.5 (ref 5.0–7.5)

## 2024-02-15 LAB — MICROSCOPIC EXAMINATION: Epithelial Cells (non renal): >10 /HPF — AB (ref 0–10)

## 2024-02-15 NOTE — Telephone Encounter (Signed)
 After hours nurse triage call in the [place] onbase. I added the document to the patients media tab and sent a message to the [facility] front office staff and office manager to ensure they were aware.

## 2024-02-19 ENCOUNTER — Other Ambulatory Visit: Payer: Self-pay | Admitting: Urology

## 2024-02-19 ENCOUNTER — Ambulatory Visit: Payer: Self-pay | Admitting: Urology

## 2024-02-19 DIAGNOSIS — R319 Hematuria, unspecified: Secondary | ICD-10-CM

## 2024-03-14 ENCOUNTER — Other Ambulatory Visit: Payer: Self-pay

## 2024-03-14 ENCOUNTER — Other Ambulatory Visit

## 2024-03-14 DIAGNOSIS — R319 Hematuria, unspecified: Secondary | ICD-10-CM

## 2024-03-14 LAB — URINALYSIS, ROUTINE W REFLEX MICROSCOPIC
Glucose, UA: NEGATIVE
Ketones, UA: NEGATIVE
Leukocytes,UA: NEGATIVE
Nitrite, UA: NEGATIVE
Specific Gravity, UA: >=1.03 — AB (ref 1.005–1.030)
Urobilinogen, Ur: 1 mg/dL (ref 0.2–1.0)
pH, UA: 5.5 (ref 5.0–7.5)

## 2024-03-14 LAB — MICROSCOPIC EXAMINATION

## 2024-03-15 ENCOUNTER — Ambulatory Visit: Payer: Self-pay | Admitting: Urology

## 2024-03-18 NOTE — Telephone Encounter (Signed)
 Patient called back asking for her results. Results were given to patient. Jenny Vasquez
